# Patient Record
Sex: Female | Born: 1965 | Race: White | Hispanic: No | Smoking: Never smoker
Health system: Southern US, Community
[De-identification: ages and names within clinical notes are randomized; demographics above are authoritative.]

## PROBLEM LIST (undated history)

## (undated) HISTORY — PX: ABDOMINAL HYSTERECTOMY: SHX81

## (undated) HISTORY — PX: TUBAL LIGATION: SHX77

## (undated) HISTORY — PX: KNEE ARTHROSCOPY: SUR90

---

## 2004-02-27 ENCOUNTER — Ambulatory Visit: Payer: Self-pay | Admitting: Family Medicine

## 2005-08-10 ENCOUNTER — Ambulatory Visit: Payer: Self-pay | Admitting: Family Medicine

## 2006-08-26 ENCOUNTER — Ambulatory Visit: Payer: Self-pay | Admitting: Unknown Physician Specialty

## 2006-09-25 ENCOUNTER — Other Ambulatory Visit: Payer: Self-pay

## 2006-09-25 ENCOUNTER — Emergency Department: Payer: Self-pay | Admitting: Unknown Physician Specialty

## 2007-08-31 ENCOUNTER — Ambulatory Visit: Payer: Self-pay | Admitting: Unknown Physician Specialty

## 2008-09-05 ENCOUNTER — Ambulatory Visit: Payer: Self-pay | Admitting: Unknown Physician Specialty

## 2009-11-08 ENCOUNTER — Ambulatory Visit: Payer: Self-pay | Admitting: Unknown Physician Specialty

## 2010-11-14 ENCOUNTER — Ambulatory Visit: Payer: Self-pay | Admitting: Unknown Physician Specialty

## 2011-11-27 ENCOUNTER — Ambulatory Visit: Payer: Self-pay | Admitting: Unknown Physician Specialty

## 2012-11-29 ENCOUNTER — Ambulatory Visit: Payer: Self-pay | Admitting: Unknown Physician Specialty

## 2013-12-11 ENCOUNTER — Ambulatory Visit: Payer: Self-pay | Admitting: Unknown Physician Specialty

## 2014-11-05 ENCOUNTER — Other Ambulatory Visit: Payer: Self-pay | Admitting: Unknown Physician Specialty

## 2014-11-05 DIAGNOSIS — Z1231 Encounter for screening mammogram for malignant neoplasm of breast: Secondary | ICD-10-CM

## 2014-12-13 ENCOUNTER — Encounter (INDEPENDENT_AMBULATORY_CARE_PROVIDER_SITE_OTHER): Payer: Self-pay

## 2014-12-13 ENCOUNTER — Ambulatory Visit
Admission: RE | Admit: 2014-12-13 | Discharge: 2014-12-13 | Disposition: A | Discharge status: Home / Self Care | Payer: BLUE CROSS/BLUE SHIELD | Source: Ambulatory Visit | Attending: Unknown Physician Specialty | Admitting: Unknown Physician Specialty

## 2014-12-13 DIAGNOSIS — Z1231 Encounter for screening mammogram for malignant neoplasm of breast: Secondary | ICD-10-CM | POA: Diagnosis not present

## 2015-12-03 ENCOUNTER — Other Ambulatory Visit: Payer: Self-pay | Admitting: Unknown Physician Specialty

## 2015-12-03 DIAGNOSIS — Z1231 Encounter for screening mammogram for malignant neoplasm of breast: Secondary | ICD-10-CM

## 2015-12-27 ENCOUNTER — Ambulatory Visit
Admission: RE | Admit: 2015-12-27 | Discharge: 2015-12-27 | Disposition: A | Discharge status: Home / Self Care | Payer: BLUE CROSS/BLUE SHIELD | Source: Ambulatory Visit | Attending: Unknown Physician Specialty | Admitting: Unknown Physician Specialty

## 2015-12-27 ENCOUNTER — Other Ambulatory Visit: Payer: Self-pay | Admitting: Unknown Physician Specialty

## 2015-12-27 DIAGNOSIS — Z1231 Encounter for screening mammogram for malignant neoplasm of breast: Secondary | ICD-10-CM

## 2016-12-29 ENCOUNTER — Other Ambulatory Visit: Payer: Self-pay | Admitting: Unknown Physician Specialty

## 2016-12-29 DIAGNOSIS — Z1231 Encounter for screening mammogram for malignant neoplasm of breast: Secondary | ICD-10-CM

## 2017-01-11 ENCOUNTER — Ambulatory Visit
Admission: RE | Admit: 2017-01-11 | Discharge: 2017-01-11 | Disposition: A | Discharge status: Home / Self Care | Payer: BLUE CROSS/BLUE SHIELD | Source: Ambulatory Visit | Attending: Unknown Physician Specialty | Admitting: Unknown Physician Specialty

## 2017-01-11 DIAGNOSIS — Z1231 Encounter for screening mammogram for malignant neoplasm of breast: Secondary | ICD-10-CM | POA: Diagnosis present

## 2017-05-24 ENCOUNTER — Emergency Department: Payer: BLUE CROSS/BLUE SHIELD

## 2017-05-24 ENCOUNTER — Other Ambulatory Visit: Payer: Self-pay

## 2017-05-24 ENCOUNTER — Emergency Department
Admission: EM | Admit: 2017-05-24 | Discharge: 2017-05-25 | Disposition: A | Discharge status: Home / Self Care | Payer: BLUE CROSS/BLUE SHIELD | Attending: Emergency Medicine | Admitting: Emergency Medicine

## 2017-05-24 DIAGNOSIS — R079 Chest pain, unspecified: Secondary | ICD-10-CM | POA: Insufficient documentation

## 2017-05-24 DIAGNOSIS — F419 Anxiety disorder, unspecified: Secondary | ICD-10-CM | POA: Insufficient documentation

## 2017-05-24 LAB — BASIC METABOLIC PANEL
ANION GAP: 10 (ref 5–15)
BUN: 19 mg/dL (ref 6–20)
CALCIUM: 9.2 mg/dL (ref 8.9–10.3)
CO2: 26 mmol/L (ref 22–32)
Chloride: 103 mmol/L (ref 101–111)
Creatinine, Ser: 0.83 mg/dL (ref 0.44–1.00)
GFR calc Af Amer: >60 mL/min (ref >60)
GLUCOSE: 96 mg/dL (ref 65–99)
Potassium: 4.3 mmol/L (ref 3.5–5.1)
SODIUM: 139 mmol/L (ref 135–145)

## 2017-05-24 LAB — CBC
HCT: 43.3 % (ref 35.0–47.0)
HEMOGLOBIN: 14.5 g/dL (ref 12.0–16.0)
MCH: 27.4 pg (ref 26.0–34.0)
MCHC: 33.5 g/dL (ref 32.0–36.0)
MCV: 81.8 fL (ref 80.0–100.0)
Platelets: 293 10*3/uL (ref 150–440)
RBC: 5.29 MIL/uL — ABNORMAL HIGH (ref 3.80–5.20)
RDW: 16.1 % — AB (ref 11.5–14.5)
WBC: 6.8 10*3/uL (ref 3.6–11.0)

## 2017-05-24 LAB — TROPONIN I: Troponin I: <0.03 ng/mL (ref <0.03)

## 2017-05-24 LAB — FIBRIN DERIVATIVES D-DIMER (ARMC ONLY): Fibrin derivatives D-dimer (ARMC): 519.12 ng/mL (FEU) — ABNORMAL HIGH (ref 0.00–499.00)

## 2017-05-24 LAB — POCT PREGNANCY, URINE: PREG TEST UR: NEGATIVE

## 2017-05-24 LAB — TSH: TSH: 2.537 u[IU]/mL (ref 0.350–4.500)

## 2017-05-24 MED ORDER — LORAZEPAM 1 MG PO TABS
1.0000 mg | ORAL_TABLET | Freq: Once | ORAL | Status: AC
  Administered 2017-05-24: 1 mg via ORAL
  Filled 2017-05-24: qty 1

## 2017-05-24 NOTE — ED Triage Notes (Signed)
Pt states central CP with HR about 120's since 6:30. C/o of pressure in ears and temporals. Denies diaphoresis, N&V. Alert, oriented, ambulatory. Denies radiation. Pt states she is wearing estrodal patch RLQ, states has worn it for a week or so. States saw OB today and driving home she felt like heart was racing. Denies feeling anxious.

## 2017-05-24 NOTE — ED Provider Notes (Signed)
Dg Chest 2 View  Result Date: 05/24/2017 CLINICAL DATA:  Tachycardia with central chest pain. EXAM: CHEST  2 VIEW COMPARISON:  09/25/2006 CXR FINDINGS: The heart size and mediastinal contours are within normal limits. No pneumonic consolidations or CHF. No effusion or pneumothorax. The visualized skeletal structures are unremarkable. IMPRESSION: No active cardiopulmonary disease. Electronically Signed   By: Tollie Ethavid  Kwon M.D.   On: 05/24/2017 20:46     Labs Reviewed  CBC - Abnormal; Notable for the following components:      Result Value   RBC 5.29 (*)    RDW 16.1 (*)    All other components within normal limits  FIBRIN DERIVATIVES D-DIMER ([facility] ONLY) - Abnormal; Notable for the following components:   Fibrin derivatives D-dimer [facility]) 519.12 (*)    All other components within normal limits  BASIC METABOLIC PANEL  TROPONIN I  TSH  TROPONIN I  POC URINE PREG, ED  POCT PREGNANCY, URINE     Clinical Course as of May 25 242  Mon May 24, 2017  2321 Assuming care from Dr. Alphonzo LemmingsMcShane.  In short, Erika PatesLoretta A Brennan is a 52 y.o. female with a chief complaint of chest pain.  Refer to the original H&P for additional details.  The current plan of care is to follow up d-dimer and repeat troponin.   [CF]  Tue May 25, 2017  0004 Slightly positive d-dimer, Dr. Alphonzo LemmingsMcShane discussed with the patient the need for CTA if positive, so will proceed with imaging. Fibrin derivatives D-dimer [facility]): (!) 519.12 [CF]  0008 Troponin is negative Troponin I: <0.03 [CF]  0240 I called radiology and found out that the computer system is down which is preventing reports from crossing over.  The representative by phone gave me the report that there were no acute findings including no pulmonary embolism.  I updated the patient and she is ready to go home.  She states that the only thing that helped was Ativan and this is consistent with Dr. Alphonzo LemmingsMcshane feeling that this was noncardiac chest pain.  I told her I would give her a very  few number of Ativan to help but she needs to follow-up with her regular doctor and/or cardiology.  I gave my usual customary return precautions.  [CF]  0242 I reviewed the patient's prescription history over the last 24 months in the multi-state controlled substances database(s) that includes [place], [place], [place], [place], [place], [place], [place], [place], [place], [place], [place], [place], [place], [place], and [place].  The patient has filled no controlled substances during that time.   [CF]  0243 Ordered full dose ASA prior to discharge  [CF]    Clinical Course User Index [CF] Erika Brennan, Austin Pongratz, MD     Final diagnoses:  Chest pain, unspecified type      Erika Brennan, Erika Cousins, MD 05/25/17 [phone]

## 2017-05-25 ENCOUNTER — Emergency Department: Payer: BLUE CROSS/BLUE SHIELD

## 2017-05-25 ENCOUNTER — Encounter: Payer: Self-pay | Admitting: Radiology

## 2017-05-25 MED ORDER — ASPIRIN 81 MG PO CHEW
324.0000 mg | CHEWABLE_TABLET | Freq: Once | ORAL | Status: AC
  Administered 2017-05-25: 324 mg via ORAL
  Filled 2017-05-25: qty 4

## 2017-05-25 MED ORDER — IOPAMIDOL (ISOVUE-370) INJECTION 76%
75.0000 mL | Freq: Once | INTRAVENOUS | Status: AC | PRN
  Administered 2017-05-25: 75 mL via INTRAVENOUS

## 2017-05-25 MED ORDER — LORAZEPAM 1 MG PO TABS: 1.0000 mg | ORAL_TABLET | Freq: Three times a day (TID) | ORAL | 0 refills | Status: AC | PRN

## 2017-05-25 NOTE — Discharge Instructions (Addendum)

## 2017-05-25 NOTE — ED Provider Notes (Signed)
[facility] Emergency Department Provider Note  ____________________________________________   I have reviewed the triage vital signs and the nursing notes. Where available I have reviewed prior notes and, if possible and indicated, outside hospital notes.    HISTORY  Chief Complaint Chest Pain    HPI Erika Brennan is a 52 y.o. female who presents today complaining of feeling anxious.  She is been feeling anxious for several weeks but more anxious recently.  She states today she had tingling in her scalp, a very poorly described chest discomfort which did not seem to radiate at this time although she has had ear warmth in the past associated with it,  The pain is a diffuse discomfort, is been there nonstop since around 630 when she states she became very anxious.  Patient has talked her primary care doctor multiple times about anxiety which is a new issue for her since menopause.  She has been given a patch for this of estrogen however that caused her back to hurts and they took her off that patch and gave her a lesser dosage and that is been pretty good but it seems to be not helping her anxiety.  She denies any SI or HI, she has some tingling in her hands sometimes when she gets this chest discomfort.  She states that sometimes it feels like her it is going to beat out of her chest.  In any event, patient has no personal or family history of PE or DVT   History reviewed. No pertinent past medical history.  There are no active problems to display for this patient.   Past Surgical History:  Procedure Laterality Date  . ABDOMINAL HYSTERECTOMY    . KNEE ARTHROSCOPY    . TUBAL LIGATION      Prior to Admission medications   Not on File    Allergies Patient has no known allergies.  Family History  Problem Relation Age of Onset  . Breast cancer Neg Hx     Social History Social History   Tobacco Use  . Smoking status: Never Smoker  Substance Use  Topics  . Alcohol use: Yes    Frequency: Never    Comment: occassionally   . Drug use: No    Review of Systems Constitutional: No fever/chills Eyes: No visual changes. ENT: No sore throat. No stiff neck no neck pain Cardiovascular: Denies chest pain. Respiratory: Denies shortness of breath. Gastrointestinal:   no vomiting.  No diarrhea.  No constipation. Genitourinary: Negative for dysuria. Musculoskeletal: Negative lower extremity swelling Skin: Negative for rash. Neurological: Negative for severe headaches, focal weakness or numbness.   ____________________________________________   PHYSICAL EXAM:  VITAL SIGNS: ED Triage Vitals  Enc Vitals Group     BP 05/24/17 2019 125/81     Pulse Rate 05/24/17 2019 (!) 111     Resp 05/24/17 2019 18     Temp 05/24/17 2019 98.1 F (36.7 C)     Temp Source 05/24/17 2019 Oral     SpO2 05/24/17 2019 96 %     Weight 05/24/17 2015 260 lb (117.9 kg)     Height 05/24/17 2015 5\' 2"  (1.575 m)     Head Circumference --      Peak Flow --      Pain Score 05/24/17 2015 10     Pain Loc --      Pain Edu? --      Excl. in GC? --     Constitutional: Alert and  oriented. Well appearing and in no acute distress. Eyes: Conjunctivae are normal Head: Atraumatic HEENT: No congestion/rhinnorhea. Mucous membranes are moist.  Oropharynx non-erythematous Neck:   Nontender with no meningismus, no masses, no stridor Cardiovascular: Normal rate, regular rhythm. Grossly normal heart sounds.  Good peripheral circulation. Respiratory: Normal respiratory effort.  No retractions. Lungs CTAB. Abdominal: Soft and nontender. No distention. No guarding no rebound Back:  There is no focal tenderness or step off.  there is no midline tenderness there are no lesions noted. there is no CVA tenderness Musculoskeletal: No lower extremity tenderness, no upper extremity tenderness. No joint effusions, no DVT signs strong distal pulses no edema Neurologic:  Normal speech  and language. No gross focal neurologic deficits are appreciated.  Skin:  Skin is warm, dry and intact. No rash noted. Psychiatric: Mood and affect are quite anxious. Speech and behavior are normal.  ____________________________________________   LABS (all labs ordered are listed, but only abnormal results are displayed)  Labs Reviewed  CBC - Abnormal; Notable for the following components:      Result Value   RBC 5.29 (*)    RDW 16.1 (*)    All other components within normal limits  BASIC METABOLIC PANEL  TROPONIN I  TSH  TROPONIN I  FIBRIN DERIVATIVES D-DIMER ([facility] ONLY)  POC URINE PREG, ED  POCT PREGNANCY, URINE    Pertinent labs  results that were available during my care of the patient were reviewed by me and considered in my medical decision making (see chart for details). ____________________________________________  EKG  I personally interpreted any EKGs ordered by me or triage Sinus tach rate 103 normal axis no acute ST elevation or depression, aside from sinus tach unremarkable EKG ____________________________________________  RADIOLOGY  Pertinent labs & imaging results that were available during my care of the patient were reviewed by me and considered in my medical decision making (see chart for details). If possible, patient and/or family made aware of any abnormal findings.  Dg Chest 2 View  Result Date: 05/24/2017 CLINICAL DATA:  Tachycardia with central chest pain. EXAM: CHEST  2 VIEW COMPARISON:  09/25/2006 CXR FINDINGS: The heart size and mediastinal contours are within normal limits. No pneumonic consolidations or CHF. No effusion or pneumothorax. The visualized skeletal structures are unremarkable. IMPRESSION: No active cardiopulmonary disease. Electronically Signed   By: Tollie Eth M.D.   On: 05/24/2017 20:46   ____________________________________________    PROCEDURES  Procedure(s) performed: None  Procedures  Critical Care performed:  None  ____________________________________________   INITIAL IMPRESSION / ASSESSMENT AND PLAN / ED COURSE  Pertinent labs & imaging results that were available during my care of the patient were reviewed by me and considered in my medical decision making (see chart for details).  Patient here with anxiety, she does have a ride home I am giving her Ativan for that, patient does describe it as anxiety, however, in addition, patient is complaining of chest discomfort, this is essentially corollary to her anxiety however given her age we are evaluating her for possible other etiologies.  Very low suspicion for ACS troponin is negative despite symptoms for 5 hours however we will resend a second troponin.  Heart rate came down as patient relaxed but given that she is on supplemental estrogen and was tachycardic here, even though I suspect the tachycardia had more to do with anxiety, we will send a d-dimer, if positive, patient may require a CT scan as she is not without risk factors for PE.  If CT is negative and second troponin however is negative as well I feel patient will be safe for close outpatient follow-up with primary care signedout at the end of my shift to dr. York Cerise  Clinical Course as of May 25 5  Mon May 24, 2017  2321 Assuming care from Dr. Alphonzo Lemmings.  In short, Erika Brennan is a 52 y.o. female with a chief complaint of chest pain.  Refer to the original H&P for additional details.  The current plan of care is to follow up d-dimer and repeat troponin.   [CF]  Tue May 25, 2017  0004 Slightly positive d-dimer, Dr. Alphonzo Lemmings discussed with the patient the need for CTA if positive, so will proceed with imaging. Fibrin derivatives D-dimer [facility]): Marland Kitchen 161.09 [CF]    Clinical Course User Index [CF] Loleta Rose, MD   ____________________________________________   FINAL CLINICAL IMPRESSION(S) / ED DIAGNOSES  Final diagnoses:  None      This chart was dictated using voice  recognition software.  Despite best efforts to proofread,  errors can occur which can change meaning.      Jeanmarie Plant, MD 05/25/17 [phone]

## 2017-12-13 ENCOUNTER — Other Ambulatory Visit: Payer: Self-pay | Admitting: Unknown Physician Specialty

## 2017-12-13 DIAGNOSIS — Z1231 Encounter for screening mammogram for malignant neoplasm of breast: Secondary | ICD-10-CM

## 2018-01-18 ENCOUNTER — Ambulatory Visit
Admission: RE | Admit: 2018-01-18 | Discharge: 2018-01-18 | Disposition: A | Discharge status: Home / Self Care | Payer: BLUE CROSS/BLUE SHIELD | Source: Ambulatory Visit | Attending: Unknown Physician Specialty | Admitting: Unknown Physician Specialty

## 2018-01-18 DIAGNOSIS — Z1231 Encounter for screening mammogram for malignant neoplasm of breast: Secondary | ICD-10-CM | POA: Insufficient documentation

## 2019-01-16 ENCOUNTER — Other Ambulatory Visit: Payer: Self-pay | Admitting: Unknown Physician Specialty

## 2019-01-16 DIAGNOSIS — Z1231 Encounter for screening mammogram for malignant neoplasm of breast: Secondary | ICD-10-CM

## 2019-01-27 IMAGING — CT CT ANGIO CHEST
2 of 6 series · 19 of 46 positions shown · IV contrast (APPLIED)
Comparison: Chest radiograph yesterday.

CLINICAL DATA: Central chest pain. Tachycardia. Insert [REDACTED]
diagnosis and indication

EXAM:
CT ANGIOGRAPHY CHEST WITH CONTRAST
TECHNIQUE: Multidetector CT imaging of the chest was performed using the
standard protocol during bolus administration of intravenous
contrast. Multiplanar CT image reconstructions and MIPs were
obtained to evaluate the vascular anatomy.
CONTRAST:  75mL ICNF4F-GS6 IOPAMIDOL (ICNF4F-GS6) INJECTION 76%

[Series 4: thins · axial · 0.74mm/px · z∈[-610,-364]mm · 17 of 271 slices shown]
[im 12/271  lung]
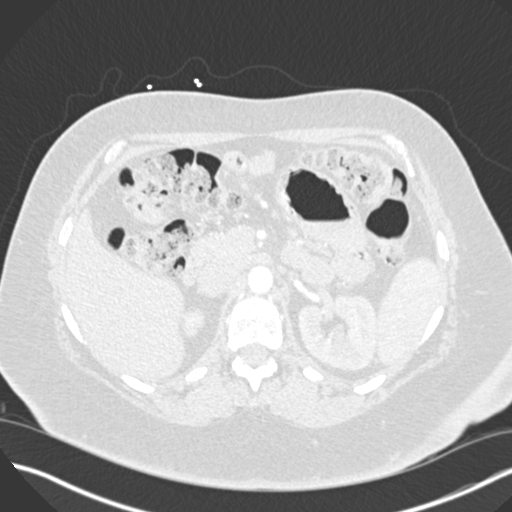
[im 24/271  soft-tissue]
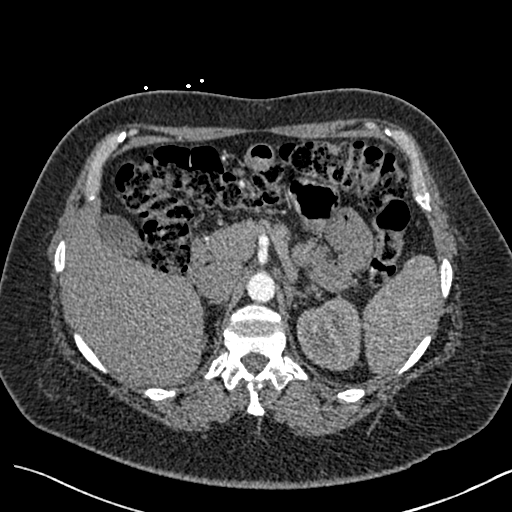
[im 47/271  lung]
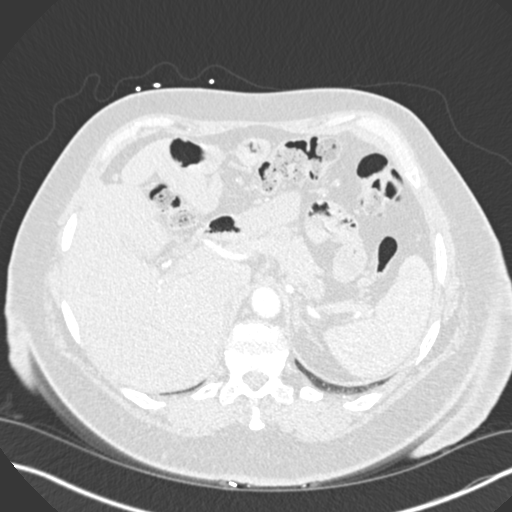
[im 59/271  soft-tissue]
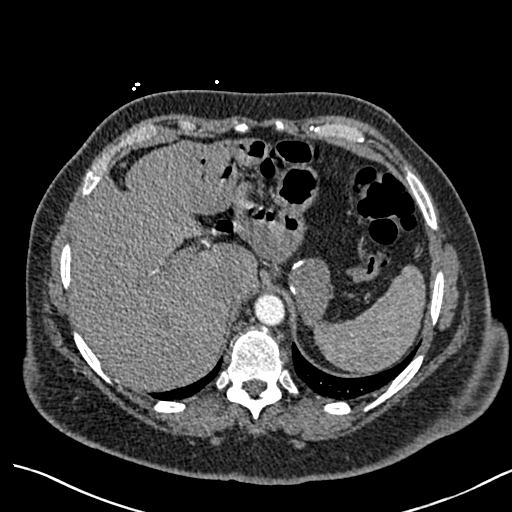
[im 71/271  lung]
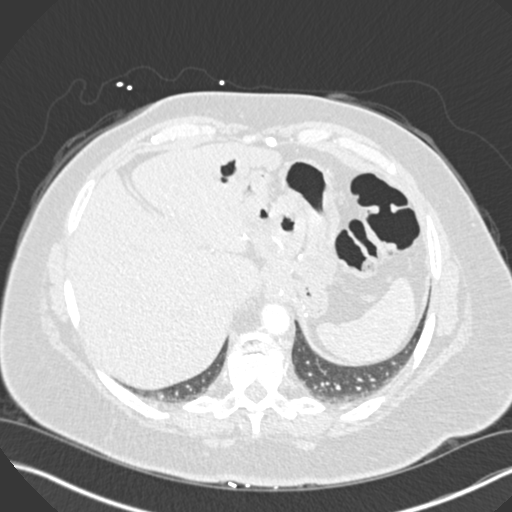
[im 94/271  soft-tissue]
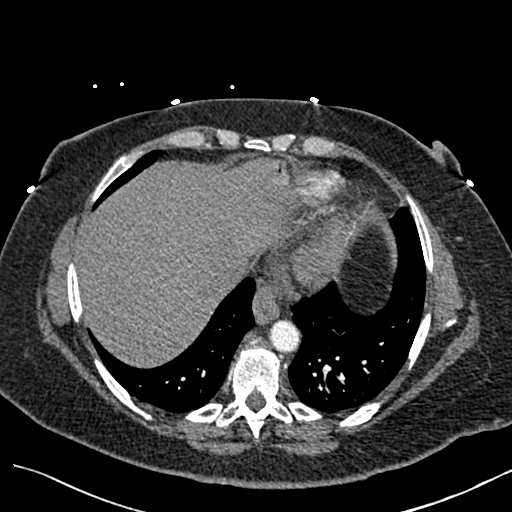
[im 106/271  lung]
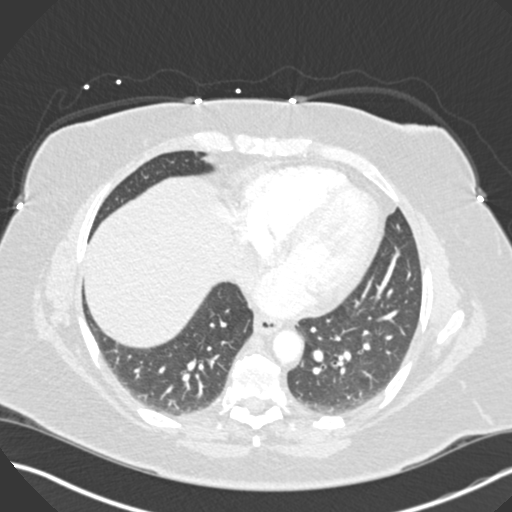
[im 118/271  soft-tissue]
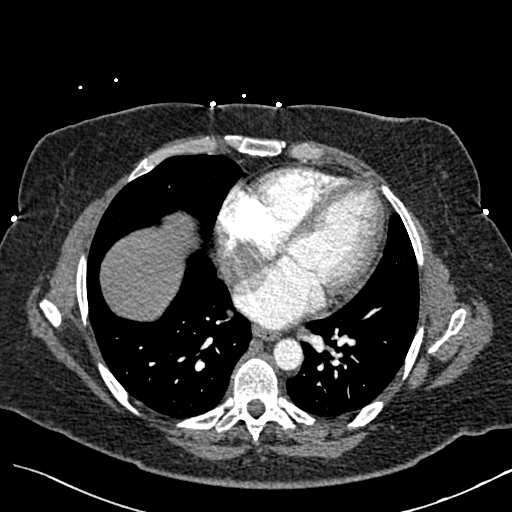
[im 141/271  lung]
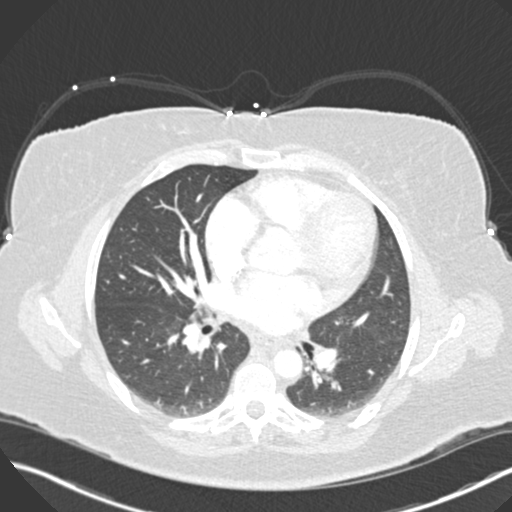
[im 153/271  soft-tissue]
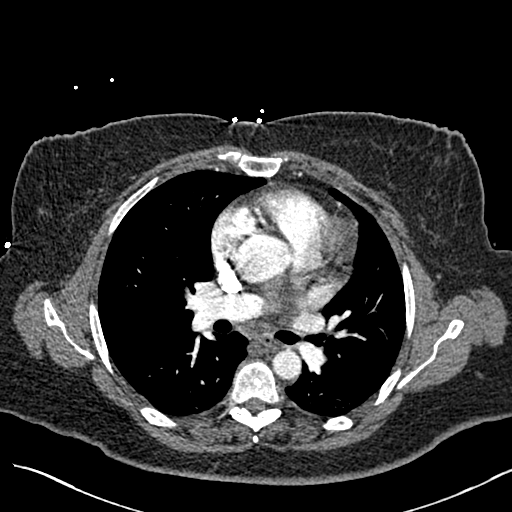
[im 165/271  lung]
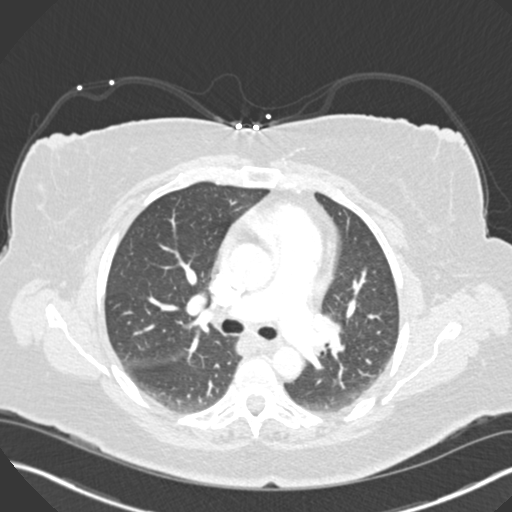
[im 177/271  soft-tissue]
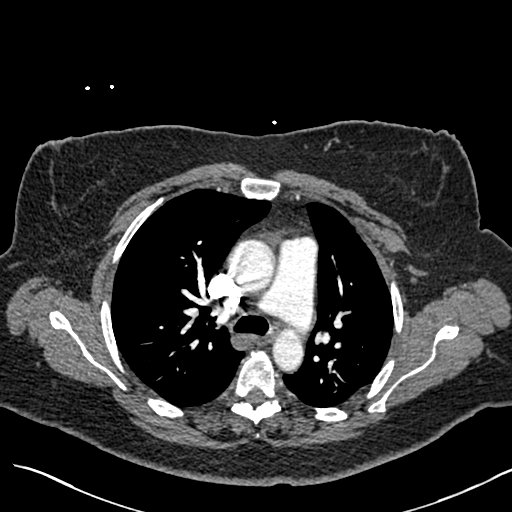
[im 200/271  lung]
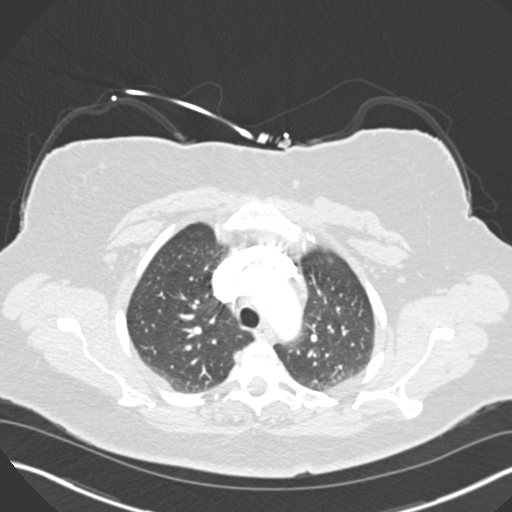
[im 212/271  soft-tissue]
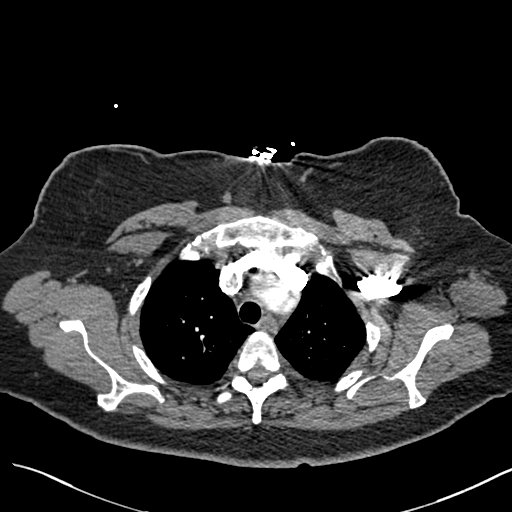
[im 224/271  lung]
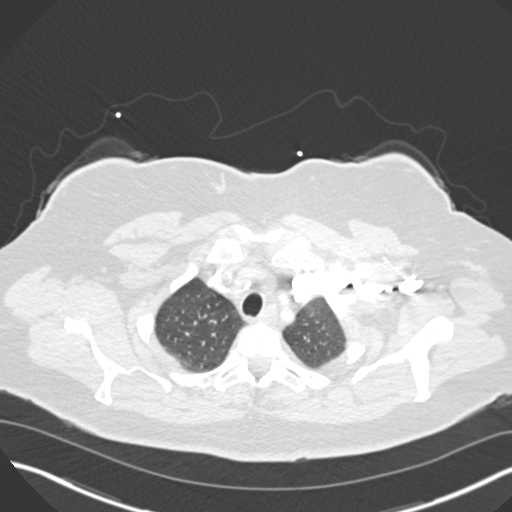
[im 247/271  soft-tissue]
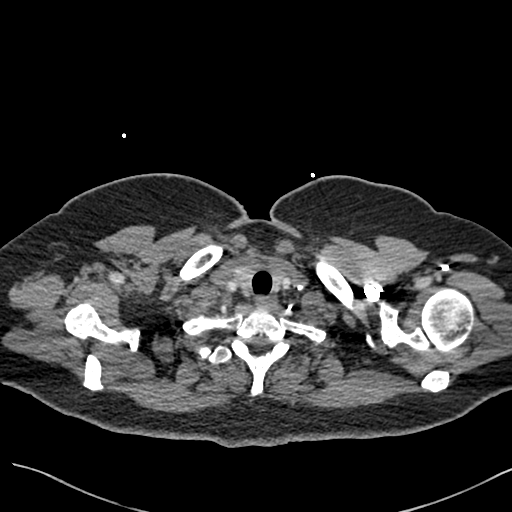
[im 259/271  lung]
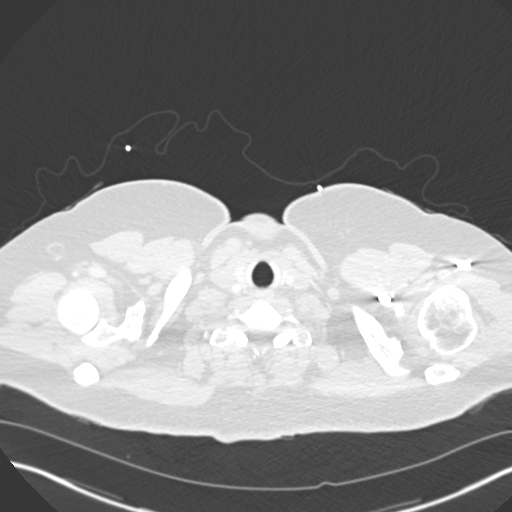

[Series 7: coronal mpr · coronal · 0.53mm/px · 2 of 82 slices shown]
[im 28/82  soft-tissue]
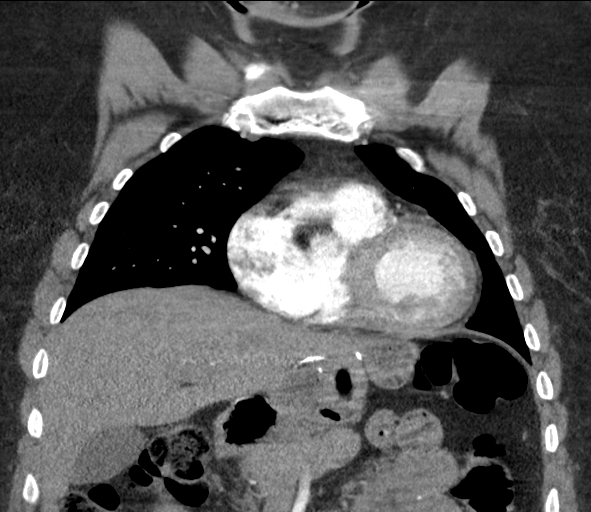
[im 55/82  soft-tissue]
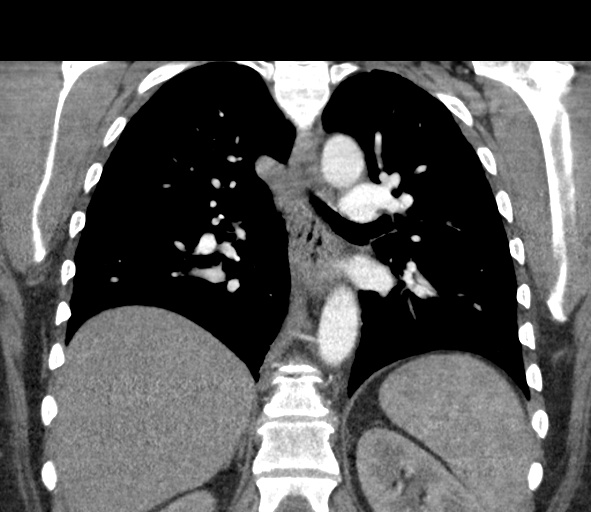

[19 of 46 positions shown; findings below may reference images not displayed]

FINDINGS: Cardiovascular: There are no filling defects within the pulmonary
arteries to suggest pulmonary embolus. Thoracic aorta is normal in
caliber without dissection. Heart size upper normal. No pericardial
effusion.

Mediastinum/Nodes: No enlarged mediastinal or hilar lymph nodes.
Visualized thyroid gland is normal. The esophagus is decompressed.
No axillary adenopathy.

Lungs/Pleura: No consolidation, pulmonary edema or pleural fluid. No
pulmonary mass or suspicious nodule.

Upper Abdomen: Post gastric bypass without complication in the
visualized upper abdomen. No acute abnormality.

Musculoskeletal: There are no acute or suspicious osseous
abnormalities. Mild degenerative change in the spine.

Review of the MIP images confirms the above findings.
IMPRESSION: No pulmonary embolus or acute intrathoracic abnormality.

## 2019-02-16 ENCOUNTER — Ambulatory Visit
Admission: RE | Admit: 2019-02-16 | Discharge: 2019-02-16 | Disposition: A | Discharge status: Home / Self Care | Payer: BC Managed Care – PPO | Source: Ambulatory Visit | Attending: Unknown Physician Specialty | Admitting: Unknown Physician Specialty

## 2019-02-16 DIAGNOSIS — Z1231 Encounter for screening mammogram for malignant neoplasm of breast: Secondary | ICD-10-CM | POA: Diagnosis not present

## 2020-02-28 ENCOUNTER — Other Ambulatory Visit: Payer: Self-pay | Admitting: Unknown Physician Specialty

## 2020-02-28 DIAGNOSIS — Z1231 Encounter for screening mammogram for malignant neoplasm of breast: Secondary | ICD-10-CM

## 2020-03-28 ENCOUNTER — Emergency Department (HOSPITAL_COMMUNITY)
Admission: EM | Admit: 2020-03-28 | Discharge: 2020-03-28 | Disposition: A | Discharge status: Home / Self Care | Payer: BC Managed Care – PPO | Attending: Emergency Medicine | Admitting: Emergency Medicine

## 2020-03-28 ENCOUNTER — Other Ambulatory Visit: Payer: Self-pay

## 2020-03-28 ENCOUNTER — Emergency Department (HOSPITAL_COMMUNITY): Payer: BC Managed Care – PPO

## 2020-03-28 ENCOUNTER — Encounter (HOSPITAL_COMMUNITY): Payer: Self-pay

## 2020-03-28 DIAGNOSIS — R509 Fever, unspecified: Secondary | ICD-10-CM | POA: Insufficient documentation

## 2020-03-28 DIAGNOSIS — R059 Cough, unspecified: Secondary | ICD-10-CM | POA: Diagnosis present

## 2020-03-28 DIAGNOSIS — U071 COVID-19: Secondary | ICD-10-CM | POA: Insufficient documentation

## 2020-03-28 LAB — CBC
HCT: 42 % (ref 36.0–46.0)
Hemoglobin: 13.6 g/dL (ref 12.0–15.0)
MCH: 28 pg (ref 26.0–34.0)
MCHC: 32.4 g/dL (ref 30.0–36.0)
MCV: 86.6 fL (ref 80.0–100.0)
Platelets: 195 10*3/uL (ref 150–400)
RBC: 4.85 MIL/uL (ref 3.87–5.11)
RDW: 14 % (ref 11.5–15.5)
WBC: 3.2 10*3/uL — ABNORMAL LOW (ref 4.0–10.5)
nRBC: 0 % (ref 0.0–0.2)

## 2020-03-28 LAB — BASIC METABOLIC PANEL
Anion gap: 10 (ref 5–15)
BUN: 13 mg/dL (ref 6–20)
CO2: 24 mmol/L (ref 22–32)
Calcium: 8.5 mg/dL — ABNORMAL LOW (ref 8.9–10.3)
Chloride: 102 mmol/L (ref 98–111)
Creatinine, Ser: 0.76 mg/dL (ref 0.44–1.00)
GFR, Estimated: >60 mL/min (ref >60)
Glucose, Bld: 112 mg/dL — ABNORMAL HIGH (ref 70–99)
Potassium: 4.2 mmol/L (ref 3.5–5.1)
Sodium: 136 mmol/L (ref 135–145)

## 2020-03-28 MED ORDER — BENZONATATE 100 MG PO CAPS: 200.0000 mg | ORAL_CAPSULE | Freq: Three times a day (TID) | ORAL | 0 refills | Status: AC

## 2020-03-28 MED ORDER — BENZONATATE 100 MG PO CAPS
200.0000 mg | ORAL_CAPSULE | Freq: Once | ORAL | Status: AC
  Administered 2020-03-28: 200 mg via ORAL
  Filled 2020-03-28: qty 2

## 2020-03-28 MED ORDER — ALBUTEROL SULFATE HFA 108 (90 BASE) MCG/ACT IN AERS
2.0000 | INHALATION_SPRAY | Freq: Once | RESPIRATORY_TRACT | Status: AC
  Administered 2020-03-28: 2 via RESPIRATORY_TRACT
  Filled 2020-03-28: qty 6.7

## 2020-03-28 NOTE — ED Notes (Signed)
Reviewed discharge instructions with patient. Follow-up care and medications reviewed. Patient  verbalized understanding. Patient A&Ox4, VSS, and ambulatory with steady gait upon discharge.  °

## 2020-03-28 NOTE — Discharge Instructions (Signed)
Please continue to quarantine at home and monitor your symptoms closely. You chest x-ray showed some very mild signs of Covid pneumonia.  Antibiotics are not helpful in treating viral infection, the virus should run its course in about 10-14 days. Please make sure you are drinking plenty of fluids. You can treat your symptoms supportively with tylenol and ibuprofen for fevers and pains, and for cough you can use prescribed cough medication as well as albuterol inhaler and throat lozenges. If your symptoms are not improving please follow up with you Primary doctor.   I recommend that you purchase a home pulse ox to help better monitor your oxygen at home, if you start to have increased work of breathing or shortness of breath or your oxygen drops below 90% please immediately return to the hospital for reevaluation.  If you develop persistent fevers, shortness of breath or difficulty breathing, chest pain, severe headache and neck pain, persistent nausea and vomiting or other new or concerning symptoms return to the Emergency department.

## 2020-03-28 NOTE — ED Triage Notes (Signed)
Pt arrives to ED w/ c/o cough and fever secondary to covid. Pt tested positive for covid on Monday. Pt denies sob, chest pain.

## 2020-03-28 NOTE — ED Notes (Signed)
Pt ambulated to room from lobby and in NAD. Pt tolerated well.

## 2020-03-28 NOTE — ED Provider Notes (Signed)
[facility] EMERGENCY DEPARTMENT Provider Note   CSN: [id number] Arrival date & time: 03/28/20  1850     History Chief Complaint  Patient presents with  . Cough  . Fever    Erika Brennan is a 54 y.o. female.  Erika Brennan is a 54 y.o. female with a history of obesity, otherwise healthy, who presents to the ED for evaluation of continued cough and fever.  Patient was diagnosed with Covid on Monday, has been having symptoms for a total of 9 days.  Symptoms initially started with sore throat a few days later developed fever and cough.  She states persistent dry cough very occasionally productive of sputum.  She states that she has been treating fevers with Motrin and Tylenol which she has been taking regularly.  She became concerned today when her fever was remaining at 102 despite medication and not responding, and this prompted her visit to the emergency department.  She states continued cough that is not improved.  She tried over-the-counter cough medications but these made her nauseous and did not seem to help.  She denies associated chest pain aside from some soreness with cough.  No shortness of breath.  No abdominal pain, nausea, vomiting or diarrhea.  She has had some body aches.  Patient received her first Covid vaccine but unfortunately contracted Covid just before she was due to receive her second vaccination.  She has not received any infusions or treatments for this Covid infection.  No history of smoking or chronic lung disease.  No other aggravating or alleviating factors.        History reviewed. No pertinent past medical history.  There are no problems to display for this patient.   Past Surgical History:  Procedure Laterality Date  . ABDOMINAL HYSTERECTOMY    . KNEE ARTHROSCOPY    . TUBAL LIGATION       OB History   No obstetric history on file.     Family History  Problem Relation Age of Onset  . Breast cancer Other     Social  History   Tobacco Use  . Smoking status: Never Smoker  Substance Use Topics  . Alcohol use: Yes    Comment: occassionally   . Drug use: No    Home Medications Prior to Admission medications   Medication Sig Start Date End Date Taking? Authorizing Provider  benzonatate (TESSALON) 100 MG capsule Take 2 capsules (200 mg total) by mouth every 8 (eight) hours for 7 days. 03/28/20 04/04/20  Dartha Lodge, PA-C    Allergies    Patient has no known allergies.  Review of Systems   Review of Systems  Constitutional: Positive for chills and fever.  HENT: Positive for congestion and sore throat.   Respiratory: Positive for cough. Negative for chest tightness, shortness of breath and wheezing.   Cardiovascular: Negative for chest pain.  Gastrointestinal: Negative for abdominal pain, diarrhea, nausea and vomiting.  Genitourinary: Negative for dysuria.  Musculoskeletal: Positive for myalgias. Negative for arthralgias.  Skin: Negative for color change and rash.  Neurological: Negative for dizziness, syncope, light-headedness and headaches.  All other systems reviewed and are negative.   Physical Exam Updated Vital Signs BP 127/88   Pulse (!) 118   Temp 99 F (37.2 C) (Oral)   Resp 18   SpO2 97%   Physical Exam Vitals and nursing note reviewed.  Constitutional:      General: She is not in acute distress.  Appearance: Normal appearance. She is well-developed. She is obese. She is not ill-appearing or diaphoretic.     Comments: Well-appearing and in no distress  HENT:     Head: Normocephalic and atraumatic.     Nose: Rhinorrhea present.     Mouth/Throat:     Mouth: Mucous membranes are moist.     Comments: Posterior oropharynx clear and mucous membranes moist, there is mild erythema but no edema or tonsillar exudates, uvula midline, normal phonation, no trismus, tolerating secretions without difficulty. Eyes:     General:        Right eye: No discharge.        Left eye: No  discharge.  Neck:     Comments: No rigidity Cardiovascular:     Rate and Rhythm: Normal rate and regular rhythm.     Heart sounds: Normal heart sounds. No murmur heard.  No friction rub. No gallop.   Pulmonary:     Effort: Pulmonary effort is normal. No respiratory distress.     Breath sounds: Normal breath sounds.     Comments: Respirations equal and unlabored, patient able to speak in full sentences satting well on room air, lungs clear to auscultation bilaterally.  Frequent dry cough during exam Abdominal:     General: Bowel sounds are normal. There is no distension.     Palpations: Abdomen is soft. There is no mass.     Tenderness: There is no abdominal tenderness. There is no guarding.     Comments: Abdomen soft, nondistended, nontender to palpation in all quadrants without guarding or peritoneal signs  Musculoskeletal:        General: No deformity.     Cervical back: Neck supple.  Lymphadenopathy:     Cervical: No cervical adenopathy.  Skin:    General: Skin is warm and dry.     Capillary Refill: Capillary refill takes less than 2 seconds.  Neurological:     Mental Status: She is alert and oriented to person, place, and time.     Comments: Speech is clear, able to follow commands Moves extremities without ataxia, coordination intact  Psychiatric:        Mood and Affect: Mood normal.     ED Results / Procedures / Treatments   Labs (all labs ordered are listed, but only abnormal results are displayed) Labs Reviewed  CBC - Abnormal; Notable for the following components:      Result Value   WBC 3.2 (*)    All other components within normal limits  BASIC METABOLIC PANEL - Abnormal; Notable for the following components:   Glucose, Bld 112 (*)    Calcium 8.5 (*)    All other components within normal limits    EKG None  Radiology DG Chest Portable 1 View  Result Date: 03/28/2020 CLINICAL DATA:  54 year old female with positive COVID-19. EXAM: PORTABLE CHEST 1 VIEW  COMPARISON:  Chest radiograph dated 05/24/2017. chest CT dated 05/25/2017. FINDINGS: Shallow inspiration. Mild diffuse interstitial prominence as well as faint bilateral lower lung field subpleural hazy densities may represent atelectasis. Atypical infiltrate is not excluded clinical correlation is recommended. No focal consolidation, pleural effusion or pneumothorax. Cardiac silhouette is within limits. No acute osseous pathology. IMPRESSION: Shallow inspiration with probable bibasilar atelectasis. Atypical infiltrate is not excluded. No focal consolidation. Electronically Signed   By: Elgie Collard M.D.   On: 03/28/2020 19:37    Procedures Procedures (including critical care time)  Medications Ordered in ED Medications  albuterol (VENTOLIN HFA) 108 (90  Base) MCG/ACT inhaler 2 puff (2 puffs Inhalation Given 03/28/20 2047)  benzonatate (TESSALON) capsule 200 mg (200 mg Oral Given 03/28/20 2047)    ED Course  I have reviewed the triage vital signs and the nursing notes.  Pertinent labs & imaging results that were available during my care of the patient were reviewed by me and considered in my medical decision making (see chart for details).    MDM Rules/Calculators/A&P                          Patient presents with known Covid infection, symptoms primarily have been cough and fever, has had some fatigue and myalgias.  Denies chest pain or shortness of breath.  Was concerned today that her fever was not improving on arrival fever has improved from 102 to 99.1, she has been treating regularly with Motrin and Tylenol.  She reports persistent cough not responding to over-the-counter cough medications.  Patient's O2 sats at 94% on room air and she exhibits normal work of breathing, able to speak in full sentences in no respiratory distress.  Lungs are clear on auscultation.  Will check basic labs and chest x-ray.  Chest x-ray with signs of very mild atypical pneumonia consistent with early Covid  pneumonia, no focal consolidation noted.  Patient's labs are reassuring, mild leukopenia as commonly seen with Covid, no significant electrolyte derangements.  Patient ambulated and maintained normal O2 sats without significant increased work of breathing.  Discussed potential benefits and risks of Mab infusion, but patient declines this therapy today, I informed her that tomorrow is the last day she would be potentially eligible for this, but she does not wish to receive this at this time.  Will treat with albuterol inhaler and Tessalon to help with cough encouraged her to continue to use Tylenol and ibuprofen at appropriate doses to manage fevers and pain.  Strict return precautions discussed and encourage patient to purchase home pulse ox to monitor oxygen.  She expresses understanding and agreement with plan.  Discharged home in good condition.  Erika Brennan was evaluated in Emergency Department on 03/29/2020 for the symptoms described in the history of present illness. She was evaluated in the context of the global COVID-19 pandemic, which necessitated consideration that the patient might be at risk for infection with the SARS-CoV-2 virus that causes COVID-19. Institutional protocols and algorithms that pertain to the evaluation of patients at risk for COVID-19 are in a state of rapid change based on information released by regulatory bodies including the CDC and federal and state organizations. These policies and algorithms were followed during the patient's care in the ED.  Final Clinical Impression(s) / ED Diagnoses Final diagnoses:  COVID-19 virus infection    Rx / DC Orders ED Discharge Orders         Ordered    benzonatate (TESSALON) 100 MG capsule  Every 8 hours        03/28/20 2029           Dartha Lodge, PA-C 03/29/20 1236    Alvira Monday, MD 03/30/20 1527

## 2020-03-28 NOTE — ED Notes (Signed)
Pt ambulated w/ pulse oximeter and maintained sats at or above 94%. Pt tolerated well

## 2020-04-09 ENCOUNTER — Telehealth: Payer: Self-pay

## 2020-04-09 NOTE — Telephone Encounter (Signed)
Pt was called per referral from [facility] to make appt w/ [facility]. Pt states that she is fully recovered

## 2020-05-02 ENCOUNTER — Other Ambulatory Visit: Payer: Self-pay

## 2020-05-02 ENCOUNTER — Ambulatory Visit
Admission: RE | Admit: 2020-05-02 | Discharge: 2020-05-02 | Disposition: A | Discharge status: Home / Self Care | Payer: BC Managed Care – PPO | Source: Ambulatory Visit | Attending: Unknown Physician Specialty | Admitting: Unknown Physician Specialty

## 2020-05-02 DIAGNOSIS — Z1231 Encounter for screening mammogram for malignant neoplasm of breast: Secondary | ICD-10-CM | POA: Diagnosis not present

## 2021-03-20 ENCOUNTER — Other Ambulatory Visit: Payer: Self-pay | Admitting: Unknown Physician Specialty

## 2021-03-20 DIAGNOSIS — Z1231 Encounter for screening mammogram for malignant neoplasm of breast: Secondary | ICD-10-CM

## 2021-05-05 ENCOUNTER — Other Ambulatory Visit: Payer: Self-pay

## 2021-05-05 ENCOUNTER — Ambulatory Visit
Admission: RE | Admit: 2021-05-05 | Discharge: 2021-05-05 | Disposition: A | Discharge status: Home / Self Care | Payer: BC Managed Care – PPO | Source: Ambulatory Visit | Attending: Unknown Physician Specialty | Admitting: Unknown Physician Specialty

## 2021-05-05 DIAGNOSIS — Z1231 Encounter for screening mammogram for malignant neoplasm of breast: Secondary | ICD-10-CM | POA: Diagnosis present

## 2022-03-23 ENCOUNTER — Other Ambulatory Visit: Payer: Self-pay | Admitting: Unknown Physician Specialty

## 2022-03-23 DIAGNOSIS — Z1231 Encounter for screening mammogram for malignant neoplasm of breast: Secondary | ICD-10-CM

## 2022-05-06 ENCOUNTER — Ambulatory Visit
Admission: RE | Admit: 2022-05-06 | Discharge: 2022-05-06 | Disposition: A | Discharge status: Home / Self Care | Payer: BC Managed Care – PPO | Source: Ambulatory Visit | Attending: Unknown Physician Specialty | Admitting: Unknown Physician Specialty

## 2022-05-06 DIAGNOSIS — Z1231 Encounter for screening mammogram for malignant neoplasm of breast: Secondary | ICD-10-CM | POA: Insufficient documentation

## 2023-03-30 ENCOUNTER — Other Ambulatory Visit: Payer: Self-pay | Admitting: Unknown Physician Specialty

## 2023-03-30 DIAGNOSIS — Z1231 Encounter for screening mammogram for malignant neoplasm of breast: Secondary | ICD-10-CM

## 2023-05-26 ENCOUNTER — Ambulatory Visit
Admission: RE | Admit: 2023-05-26 | Discharge: 2023-05-26 | Disposition: A | Discharge status: Home / Self Care | Payer: BC Managed Care – PPO | Source: Ambulatory Visit | Attending: Unknown Physician Specialty | Admitting: Unknown Physician Specialty

## 2023-05-26 DIAGNOSIS — Z1231 Encounter for screening mammogram for malignant neoplasm of breast: Secondary | ICD-10-CM | POA: Diagnosis present
